# Patient Record
Sex: Male | Born: 2005 | Race: Black or African American | Hispanic: No | Marital: Single | Smoking: Never smoker
Health system: Southern US, Community
[De-identification: ages and names within clinical notes are randomized; demographics above are authoritative.]

## PROBLEM LIST (undated history)

## (undated) DIAGNOSIS — J45909 Unspecified asthma, uncomplicated: Secondary | ICD-10-CM

---
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
## 2023-04-25 ENCOUNTER — Ambulatory Visit (HOSPITAL_COMMUNITY)
Admission: EM | Admit: 2023-04-25 | Discharge: 2023-04-25 | Disposition: A | Discharge status: Home / Self Care | Payer: 59 | Attending: Family Medicine | Admitting: Family Medicine

## 2023-04-25 ENCOUNTER — Encounter (HOSPITAL_COMMUNITY): Payer: Self-pay

## 2023-04-25 DIAGNOSIS — H1033 Unspecified acute conjunctivitis, bilateral: Secondary | ICD-10-CM | POA: Diagnosis not present

## 2023-04-25 MED ORDER — POLYMYXIN B-TRIMETHOPRIM 10000-0.1 UNIT/ML-% OP SOLN: 2.0000 [drp] | Freq: Three times a day (TID) | OPHTHALMIC | 0 refills | Status: AC

## 2023-04-25 NOTE — ED Provider Notes (Signed)
[facility]    CSN: [id number] Arrival date & time: 04/25/23  1804      History   Chief Complaint Chief Complaint  Patient presents with   Conjunctivitis    HPI Trigg Delarocha is a 17 y.o. male.   HPI Patient with a history of preseptal cellulitis involving the right eye presents today with a 1 day history of awakening with severely reddened eyes and as the day has progressed he has started to have purulent drainage from both eyes.  Past Medical History:  Diagnosis Date   Asthma     Patient Active Problem List   Diagnosis Date Noted   Preseptal cellulitis of right eye 01/27/2017    Past Surgical History:  Procedure Laterality Date   SHOULDER ARTHROSCOPY WITH BANKART REPAIR Left 12/07/2020   Procedure: SHOULDER ARTHROSCOPY WITH BANKART REPAIR;  Surgeon: Bjorn Pippin, MD;  Location: [facility];  Service: Orthopedics;  Laterality: Left;       Home Medications    Prior to Admission medications   Medication Sig Start Date End Date Taking? Authorizing Provider  acetaminophen (TYLENOL 8 HOUR) 650 MG CR tablet Take 1 tablet (650 mg total) by mouth every 8 (eight) hours. 12/07/20   Vernetta Honey, PA-C    Family History No family history on file.  Social History Social History   Tobacco Use   Smoking status: Never   Smokeless tobacco: Never  Vaping Use   Vaping status: Never Used  Substance Use Topics   Alcohol use: No   Drug use: No     Allergies   Peanut-containing drug products and Other   Review of Systems Review of Systems   Physical Exam Triage Vital Signs ED Triage Vitals  Encounter Vitals Group     BP 04/25/23 1937 125/63     Systolic BP Percentile --      Diastolic BP Percentile --      Pulse Rate 04/25/23 1937 (!) 50     Resp 04/25/23 1937 16     Temp 04/25/23 1937 98.3 F (36.8 C)     Temp Source 04/25/23 1937 Oral     SpO2 04/25/23 1937 98 %     Weight --      Height 04/25/23 1937 6\' 2"  (1.88 m)      Head Circumference --      Peak Flow --      Pain Score 04/25/23 1936 0     Pain Loc --      Pain Education --      Exclude from Growth Chart --    No data found.  Updated Vital Signs BP 125/63 (BP Location: Left Arm)   Pulse (!) 50   Temp 98.3 F (36.8 C) (Oral)   Resp 16   Ht 6\' 2"  (1.88 m)   SpO2 98%   Visual Acuity Right Eye Distance:   Left Eye Distance:   Bilateral Distance:    Right Eye Near:   Left Eye Near:    Bilateral Near:     Physical Exam   UC Treatments / Results  Labs (all labs ordered are listed, but only abnormal results are displayed) Labs Reviewed - No data to display  EKG   Radiology No results found.  Procedures Procedures (including critical care time)  Medications Ordered in UC Medications - No data to display  Initial Impression / Assessment and Plan / UC Course  I have reviewed the triage vital signs and the  nursing notes.  Pertinent labs & imaging results that were available during my care of the patient were reviewed by me and considered in my medical decision making (see chart for details).     *** Final Clinical Impressions(s) / UC Diagnoses   Final diagnoses:  None   Discharge Instructions   None    ED Prescriptions   None    PDMP not reviewed this encounter.

## 2023-04-25 NOTE — ED Triage Notes (Signed)
Patient woke up with redness and irritation in both eyes. No known sick exposure. Patient states he did rub his eyes while cleaning shrimp today.

## 2023-12-22 ENCOUNTER — Encounter (HOSPITAL_COMMUNITY): Payer: Self-pay

## 2023-12-22 ENCOUNTER — Other Ambulatory Visit: Payer: Self-pay

## 2023-12-22 ENCOUNTER — Emergency Department (HOSPITAL_COMMUNITY)
Admission: EM | Admit: 2023-12-22 | Discharge: 2023-12-22 | Disposition: A | Discharge status: Home / Self Care | Attending: Emergency Medicine | Admitting: Emergency Medicine

## 2023-12-22 DIAGNOSIS — M546 Pain in thoracic spine: Secondary | ICD-10-CM | POA: Diagnosis present

## 2023-12-22 MED ORDER — KETOROLAC TROMETHAMINE 15 MG/ML IJ SOLN
15.0000 mg | Freq: Once | INTRAMUSCULAR | Status: AC
  Administered 2023-12-22: 15 mg via INTRAMUSCULAR
  Filled 2023-12-22: qty 1

## 2023-12-22 MED ORDER — CYCLOBENZAPRINE HCL 10 MG PO TABS: 10.0000 mg | ORAL_TABLET | Freq: Two times a day (BID) | ORAL | 0 refills | Status: AC | PRN

## 2023-12-22 MED ORDER — CYCLOBENZAPRINE HCL 10 MG PO TABS
10.0000 mg | ORAL_TABLET | Freq: Once | ORAL | Status: AC
  Administered 2023-12-22: 10 mg via ORAL
  Filled 2023-12-22: qty 1

## 2023-12-22 NOTE — Discharge Instructions (Addendum)
 It was a pleasure taking part in your care.  As we discussed, please rest for the next 2 days.  Please take ibuprofen, Tylenol every 6 hours.  Please take muscle relaxer, Flexeril, twice a day as needed.  Please do not drive or operate heavy machinery while taking this medication.  Do not mix with alcohol.  Follow-up with your PCP.  Return to the ED with any new or worsening symptoms.

## 2023-12-22 NOTE — ED Provider Notes (Signed)
 [place] EMERGENCY DEPARTMENT AT [facility] Provider Note   CSN: [id number] Arrival date & time: 12/22/23  1347     History  Chief Complaint  Patient presents with   Back Pain    Ruben Frye is a 18 y.o. male who presents to the ED for evaluation of midthoracic back pain.  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  States that on Friday morning after a night shift, he developed a pain under his right scapula.  He states that the pain is worsened when he forward flexes his cervical spine.  He reports no trauma to his back.  He denies any dysuria, fevers, nausea or vomiting.  Denies medications at home prior to arrival.   Back Pain Associated symptoms: no dysuria, no numbness and no weakness        Home Medications Prior to Admission medications   Medication Sig Start Date End Date Taking? Authorizing Provider  cyclobenzaprine (FLEXERIL) 10 MG tablet Take 1 tablet (10 mg total) by mouth 2 (two) times daily as needed for muscle spasms. 12/22/23  Yes Al Decant, PA-C      Allergies    Peanut-containing drug products and Other    Review of Systems   Review of Systems  Genitourinary:  Negative for dysuria.  Musculoskeletal:  Positive for back pain. Negative for neck pain.  Neurological:  Negative for weakness and numbness.  All other systems reviewed and are negative.   Physical Exam Updated Vital Signs BP (!) 139/95   Pulse 70   Temp 98.5 F (36.9 C) (Oral)   Resp (!) 22   Ht 6\' 1"  (1.854 m)   Wt 86.2 kg   SpO2 100%   BMI 25.07 kg/m  Physical Exam Vitals and nursing note reviewed.  Constitutional:      General: He is not in acute distress.    Appearance: He is well-developed.  HENT:     Head: Normocephalic and atraumatic.  Eyes:     Conjunctiva/sclera: Conjunctivae normal.  Cardiovascular:     Rate and Rhythm: Normal rate and regular rhythm.     Heart sounds: No murmur heard. Pulmonary:     Effort: Pulmonary effort is  normal. No respiratory distress.     Breath sounds: Normal breath sounds.  Abdominal:     Palpations: Abdomen is soft.     Tenderness: There is no abdominal tenderness.  Musculoskeletal:        General: No swelling.     Cervical back: Neck supple.       Back:  Skin:    General: Skin is warm and dry.     Capillary Refill: Capillary refill takes less than 2 seconds.  Neurological:     General: No focal deficit present.     Mental Status: He is alert.     GCS: GCS eye subscore is 4. GCS verbal subscore is 5. GCS motor subscore is 6.     Cranial Nerves: Cranial nerves 2-12 are intact. No cranial nerve deficit.     Sensory: Sensation is intact. No sensory deficit.     Motor: Motor function is intact. No weakness.  Psychiatric:        Mood and Affect: Mood normal.     ED Results / Procedures / Treatments   Labs (all labs ordered are listed, but only abnormal results are displayed) Labs Reviewed - No data to display  EKG None  Radiology No results found.  Procedures Procedures  Medications Ordered in ED Medications  ketorolac (TORADOL) 15 MG/ML injection 15 mg (15 mg Intramuscular Given 12/22/23 1640)  cyclobenzaprine (FLEXERIL) tablet 10 mg (10 mg Oral Given 12/22/23 1640)    ED Course/ Medical Decision Making/ A&P  Medical Decision Making Risk Prescription drug management.   18 year old male presents for evaluation of thoracic back pain.  Please see HPI for further details.  On exam, patient is afebrile and nontachycardic.  Lung sounds are clear bilaterally, he is not hypoxic.  Abdomen is soft and compressible.  Neurological examinations at baseline without focal neurodeficits.  Overall nontoxic in appearance.  Suspect MSK strain.  Patient reports that this pain is worse when he has forward flexion of the cervical spine causing pain around his right scapula.  Will provide Toradol, Flexeril and reassess.  After Toradol and Flexeril, patient reports his pain is  decreased.  He is able to flex his cervical spine forward without pain.  Will send him home with muscle relaxers.  Will advise him to take ibuprofen and Tylenol.  Will provide work note and encouraged rest for the next 48 hours.  Patient amenable to plan.  All questions answered to the patient satisfaction.  Stable to discharge.   Final Clinical Impression(s) / ED Diagnoses Final diagnoses:  Acute right-sided thoracic back pain    Rx / DC Orders ED Discharge Orders          Ordered    cyclobenzaprine (FLEXERIL) 10 MG tablet  2 times daily PRN        12/22/23 1721              Al Decant, PA-C 12/22/23 1722    Benjiman Core, MD 12/22/23 2144

## 2023-12-22 NOTE — ED Triage Notes (Signed)
 Pt came in via POV d/t a sudden sharp pain he felt in the Rt-mid back area while he was sitting around 1700 yesterday. Pt reports he has to "hunch" forward to relieve the discomfort & the longer he does this it hurts more. 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 Rates hi [place] 5/10 during triage.
